# Patient Record
Sex: Female | Born: 1975 | Race: White | Hispanic: No | Marital: Married | Smoking: Never smoker
Health system: Southern US, Community
[De-identification: ages and names within clinical notes are randomized; demographics above are authoritative.]

## PROBLEM LIST (undated history)

## (undated) DIAGNOSIS — L719 Rosacea, unspecified: Secondary | ICD-10-CM

## (undated) DIAGNOSIS — Z8619 Personal history of other infectious and parasitic diseases: Secondary | ICD-10-CM

## (undated) HISTORY — DX: Personal history of other infectious and parasitic diseases: Z86.19

## (undated) HISTORY — DX: Rosacea, unspecified: L71.9

## (undated) HISTORY — PX: KNEE SURGERY: SHX244

---

## 2011-08-29 ENCOUNTER — Other Ambulatory Visit: Payer: Self-pay | Admitting: Family Medicine

## 2011-08-29 ENCOUNTER — Ambulatory Visit
Admission: RE | Admit: 2011-08-29 | Discharge: 2011-08-29 | Disposition: A | Discharge status: Home / Self Care | Payer: BC Managed Care – PPO | Source: Ambulatory Visit | Attending: Family Medicine | Admitting: Family Medicine

## 2011-08-29 DIAGNOSIS — J209 Acute bronchitis, unspecified: Secondary | ICD-10-CM

## 2011-08-29 DIAGNOSIS — R05 Cough: Secondary | ICD-10-CM

## 2011-08-29 MED ORDER — IOHEXOL 300 MG/ML  SOLN
75.0000 mL | Freq: Once | INTRAMUSCULAR | Status: AC | PRN
  Administered 2011-08-29: 75 mL via INTRAVENOUS

## 2011-09-01 ENCOUNTER — Other Ambulatory Visit: Payer: Self-pay

## 2011-09-02 ENCOUNTER — Other Ambulatory Visit: Payer: Self-pay | Admitting: Family Medicine

## 2011-09-02 DIAGNOSIS — K7689 Other specified diseases of liver: Secondary | ICD-10-CM

## 2011-09-11 ENCOUNTER — Other Ambulatory Visit: Payer: BC Managed Care – PPO

## 2011-10-01 ENCOUNTER — Ambulatory Visit
Admission: RE | Admit: 2011-10-01 | Discharge: 2011-10-01 | Disposition: A | Discharge status: Home / Self Care | Payer: BC Managed Care – PPO | Source: Ambulatory Visit | Attending: Family Medicine | Admitting: Family Medicine

## 2011-10-01 DIAGNOSIS — K7689 Other specified diseases of liver: Secondary | ICD-10-CM

## 2011-10-01 MED ORDER — GADOXETATE DISODIUM 0.25 MMOL/ML IV SOLN: 6.0000 mL | Freq: Once | INTRAVENOUS | Status: AC | PRN

## 2014-01-01 ENCOUNTER — Ambulatory Visit: Payer: BC Managed Care – PPO

## 2014-01-01 ENCOUNTER — Ambulatory Visit (INDEPENDENT_AMBULATORY_CARE_PROVIDER_SITE_OTHER): Payer: BC Managed Care – PPO | Admitting: Family Medicine

## 2014-01-01 VITALS — BP 104/64 | HR 84 | Temp 98.3°F | Resp 16 | Ht 64.5 in | Wt 125.6 lb

## 2014-01-01 DIAGNOSIS — M25569 Pain in unspecified knee: Secondary | ICD-10-CM

## 2014-01-01 DIAGNOSIS — M25561 Pain in right knee: Secondary | ICD-10-CM

## 2014-01-01 DIAGNOSIS — T148XXA Other injury of unspecified body region, initial encounter: Secondary | ICD-10-CM

## 2014-01-01 NOTE — Progress Notes (Signed)
Chief Complaint:  Chief Complaint  Patient presents with  . Knee Injury    Rt, skiing yesterday afternoon, heard and felt it pop    HPI: Sheila Jensen is a 38 y.o. female who is here for  Right knee pain since yesterday, she [place] [employer] when she was wedging her skis and then had another skier hit her skis and her knees knocke against each other , heard her knees pop. She has not had any numbness or tingling. Denies weakness. Feels slighly unstable when she moves her knee in a certain position, otherwise she has been weightbearing on it. She is denies clicks, deneis constant pain. When she moves knee ie bednign it or pivot it she has sharp pain on the medial side of right knee and also pop and click but not always. Has tried ibuprofen and ice. No prior knee injuries.   History reviewed. No pertinent past medical history. History reviewed. No pertinent past surgical history. History   Social History  . Marital Status: Married    Spouse Name: N/A    Number of Children: N/A  . Years of Education: N/A   Social History Main Topics  . Smoking status: Never Smoker   . Smokeless tobacco: None  . Alcohol Use: No  . Drug Use: No  . Sexual Activity: None   Other Topics Concern  . None   Social History Narrative  . None   History reviewed. No pertinent family history. No Known Allergies Prior to Admission medications   Not on File     ROS: The patient denies fevers, chills, night sweats, unintentional weight loss, chest pain, palpitations, wheezing, dyspnea on exertion, nausea, vomiting, abdominal pain, dysuria, hematuria, melena, numbness, weakness, or tingling.   All other systems have been reviewed and were otherwise negative with the exception of those mentioned in the HPI and as above.    PHYSICAL EXAM: Filed Vitals:   01/01/14 1738  BP: 104/64  Pulse: 84  Temp: 98.3 F (36.8 C)  Resp: 16   Filed Vitals:   01/01/14 1738  Height: 5' 4.5" (1.638 m)    Weight: 125 lb 9.6 oz (56.972 kg)   Body mass index is 21.23 kg/(m^2).  General: Alert, no acute distress HEENT:  Normocephalic, atraumatic, oropharynx patent. EOMI, PERRLA Cardiovascular:  Regular rate and rhythm, no rubs murmurs or gallops.  No Carotid bruits, radial pulse intact. No pedal edema.  Respiratory: Clear to auscultation bilaterally.  No wheezes, rales, or rhonchi.  No cyanosis, no use of accessory musculature GI: No organomegaly, abdomen is soft and non-tender, positive bowel sounds.  No masses. Skin: No rashes. Neurologic: Facial musculature symmetric. Psychiatric: Patient is appropriate throughout our interaction. Lymphatic: No cervical lymphadenopathy Musculoskeletal: Gait intact. Right knee-no deformity, no crepitus, minimal swelling at medial coll ligament area, no jt line tenderness, + MCL tenderness Neg Lachman, neg ant drawer 5/5 strength Quadricep is nl in appearance, good strength   LABS: No results found for this or any previous visit.   EKG/XRAY:   Primary read interpreted by Dr. Conley Rolls at [facility]. No fx/dislocation   ASSESSMENT/PLAN: Encounter Diagnoses  Name Primary?  . Knee pain, right Yes  . Sprain and strain    Otc meds Knee brace given She does not want anything stronger If no improvement  and instability gets worse then refer to ortho F/u prn  Gross sideeffects, risk and benefits, and alternatives of medications d/w patient. Patient is aware that all medications  have potential sideeffects and we are unable to predict every sideeffect or drug-drug interaction that may occur.  Hamilton CapriLE, Olukemi Panchal PHUONG, DO 01/01/2014 6:05 PM

## 2014-01-19 ENCOUNTER — Telehealth: Payer: Self-pay

## 2014-01-19 NOTE — Telephone Encounter (Signed)
Pt is still having right knee pain and would like to talk with dr Conley Rollsle about getting a referral to an orthopedics office in [place] as [place] in [place]   Best number [phone]

## 2014-01-24 NOTE — Telephone Encounter (Signed)
Please advise 

## 2014-01-25 NOTE — Telephone Encounter (Signed)
Made appt for her for Friday March 27 @ 9 AM with Albertha Gheeebecca Bassett MD [facility] Advise to take ibuprofen 600 mg TID since not taking it Stopped wearing knee brace after 1 day, wanted more mobility She has issues with medial knee tenderness and also flexion and extension

## 2016-02-23 IMAGING — CR DG KNEE COMPLETE 4+V*R*
5 series · 5 of 5 positions shown · non-contrast
Comparison: None.

CLINICAL DATA: Skiing injury. Twisting injury to the right knee
with pain.

EXAM:
RIGHT KNEE - COMPLETE 4+ VIEW

[AP]
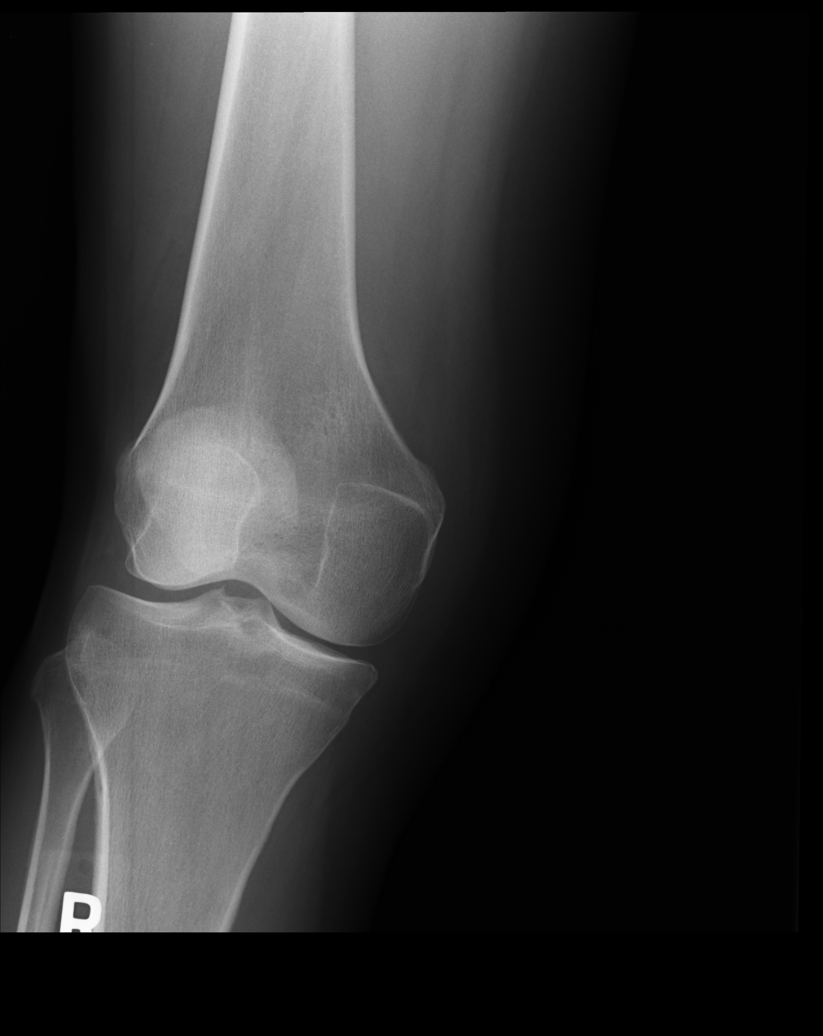

[lateral]
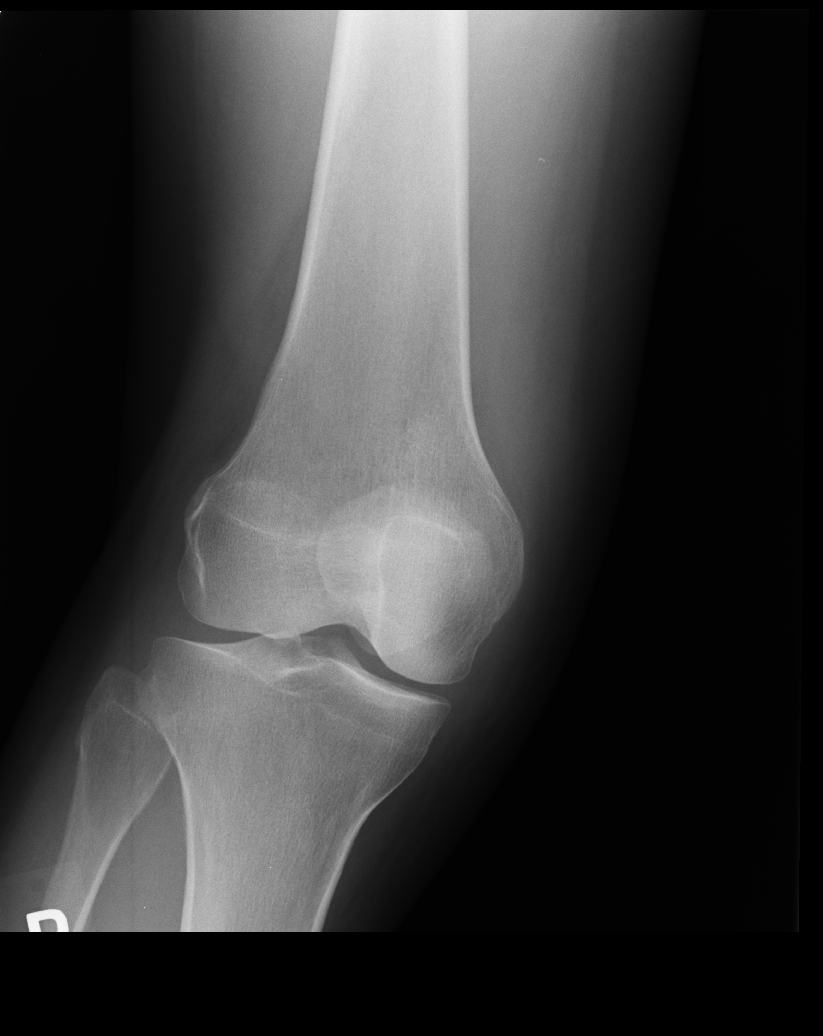

[ap ext rot]
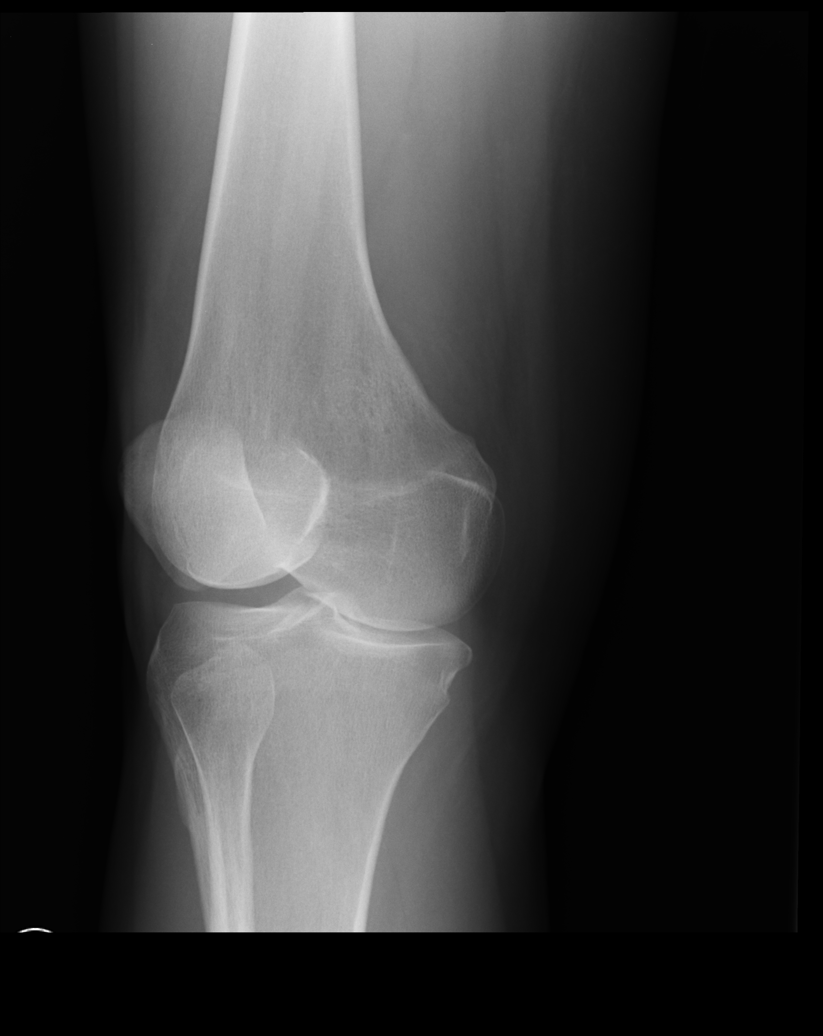

[ap int rot]
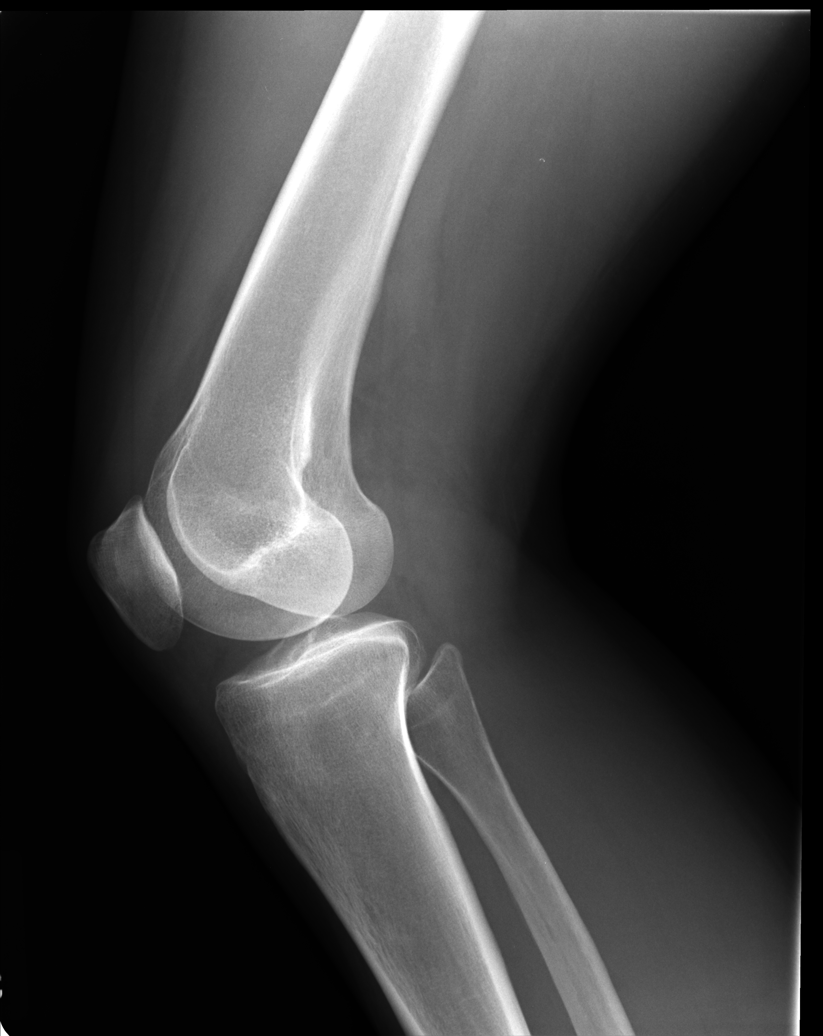

[sunrise]
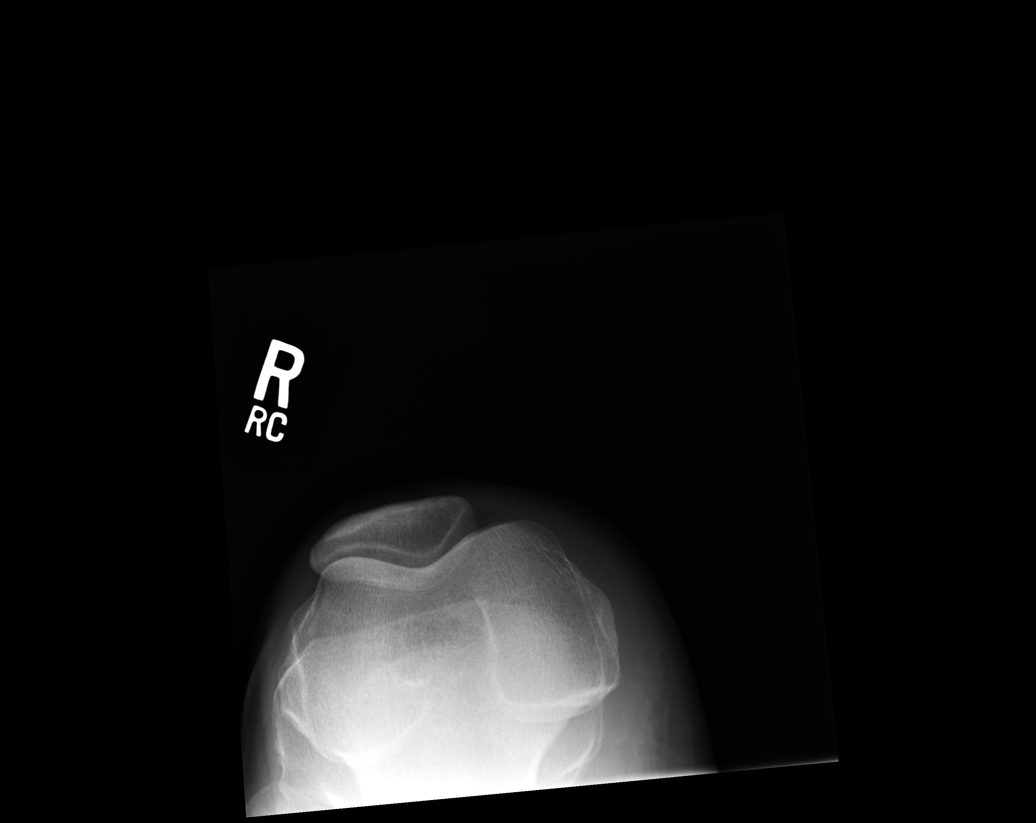

[5 of 5 positions shown; findings below may reference images not displayed]

FINDINGS: There is no evidence of fracture, dislocation, or joint effusion.
There is no evidence of arthropathy or other focal bone abnormality.
Soft tissues are unremarkable.
IMPRESSION: Negative.

## 2017-08-10 ENCOUNTER — Ambulatory Visit (INDEPENDENT_AMBULATORY_CARE_PROVIDER_SITE_OTHER): Payer: BLUE CROSS/BLUE SHIELD | Admitting: Physician Assistant

## 2017-08-10 ENCOUNTER — Encounter: Payer: Self-pay | Admitting: Physician Assistant

## 2017-08-10 VITALS — BP 114/60 | HR 85 | Temp 98.7°F | Resp 12 | Ht 65.0 in | Wt 119.0 lb

## 2017-08-10 DIAGNOSIS — Z23 Encounter for immunization: Secondary | ICD-10-CM

## 2017-08-10 DIAGNOSIS — R195 Other fecal abnormalities: Secondary | ICD-10-CM

## 2017-08-10 DIAGNOSIS — L719 Rosacea, unspecified: Secondary | ICD-10-CM | POA: Insufficient documentation

## 2017-08-10 NOTE — Progress Notes (Signed)
   Patient presents to clinic today c/o abdominal symptoms. Endorses bloating starting in summer. States this has resolved but she has noted some increased frequency of stools. Has to go to the restroom within 1 hour of eating, regardless of what she eats. Denies melena, hematochezia. Endorses consistent diet. Endorses chronic stressors. Denies anxiety or depressed mood.    Past Medical History:  Diagnosis Date  . History of chickenpox   . Rosacea     No current outpatient prescriptions on file prior to visit.   No current facility-administered medications on file prior to visit.     Allergies  Allergen Reactions  . Minocycline Other (See Comments)    Mouth sores    Family History  Problem Relation Age of Onset  . Healthy Mother   . Cancer Father        Skin  . Hypertension Father   . Healthy Sister   . Healthy Maternal Grandmother   . Cancer Maternal Grandfather        Brain    Social History   Social History  . Marital status: Married    Spouse name: N/A  . Number of children: N/A  . Years of education: N/A   Social History Main Topics  . Smoking status: Never Smoker  . Smokeless tobacco: Never Used  . Alcohol use Yes     Comment: 1--2 glasses a week  . Drug use: No  . Sexual activity: Yes    Birth control/ protection: IUD   Other Topics Concern  . None   Social History Narrative  . None    Review of Systems - See HPI.  All other ROS are negative.  BP 114/60   Pulse 85   Temp 98.7 F (37.1 C) (Oral)   Resp 12   Ht '5\' 5"'$  (1.651 m)   Wt 119 lb (54 kg)   SpO2 98%   BMI 19.80 kg/m   Physical Exam  Constitutional: She is oriented to person, place, and time and well-developed, well-nourished, and in no distress.  HENT:  Head: Normocephalic and atraumatic.  Eyes: Conjunctivae are normal.  Cardiovascular: Normal rate, regular rhythm, normal heart sounds and intact distal pulses.   Pulmonary/Chest: Effort normal and breath sounds normal. No  respiratory distress. She has no wheezes. She has no rales. She exhibits no tenderness.  Abdominal: Soft. She exhibits no mass. There is no tenderness. There is no rebound and no guarding.  Bowel sounds mildly hyperactive  Neurological: She is alert and oriented to person, place, and time. No cranial nerve deficit.  Skin: Skin is warm and dry. No rash noted.  Psychiatric: Affect normal.  Vitals reviewed.  Assessment/Plan: 1. Loose stools Does not seem consistent with infectious. 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 Will check labs today. Start fiber supplement and probiotic. Will alter regimen based on results and response to supportive measures. - CBC - TSH - Comp Met (CMET)  2. Need for immunization against influenza FLu shot given today. - Flu Vaccine QUAD 36+ mos IM   Leeanne Rio, PA-C

## 2017-08-10 NOTE — Patient Instructions (Signed)
Please go to the lab for blood work. I will call with results. Start a daily probiotic -- I recommend Align.   We will alter regimen based on results.

## 2017-08-10 NOTE — Progress Notes (Signed)
Pre visit review using our clinic review tool, if applicable. No additional management support is needed unless otherwise documented below in the visit note. 

## 2017-08-11 LAB — COMPREHENSIVE METABOLIC PANEL
AG Ratio: 2 (calc) (ref 1.0–2.5)
ALKALINE PHOSPHATASE (APISO): 42 U/L (ref 33–115)
ALT: 16 U/L (ref 6–29)
AST: 23 U/L (ref 10–30)
Albumin: 4.7 g/dL (ref 3.6–5.1)
BILIRUBIN TOTAL: 0.6 mg/dL (ref 0.2–1.2)
BUN: 13 mg/dL (ref 7–25)
CALCIUM: 9.6 mg/dL (ref 8.6–10.2)
CO2: 20 mmol/L (ref 20–32)
CREATININE: 0.82 mg/dL (ref 0.50–1.10)
Chloride: 108 mmol/L (ref 98–110)
Globulin: 2.3 g/dL (calc) (ref 1.9–3.7)
Glucose, Bld: 103 mg/dL — ABNORMAL HIGH (ref 65–99)
Potassium: 4 mmol/L (ref 3.5–5.3)
Sodium: 142 mmol/L (ref 135–146)
TOTAL PROTEIN: 7 g/dL (ref 6.1–8.1)

## 2017-08-11 LAB — CBC
HCT: 37.8 % (ref 35.0–45.0)
Hemoglobin: 12.9 g/dL (ref 11.7–15.5)
MCH: 32.7 pg (ref 27.0–33.0)
MCHC: 34.1 g/dL (ref 32.0–36.0)
MCV: 95.7 fL (ref 80.0–100.0)
MPV: 11.6 fL (ref 7.5–12.5)
PLATELETS: 180 10*3/uL (ref 140–400)
RBC: 3.95 10*6/uL (ref 3.80–5.10)
RDW: 11.3 % (ref 11.0–15.0)
WBC: 4.7 10*3/uL (ref 3.8–10.8)

## 2017-08-11 LAB — TSH: TSH: 1.7 m[IU]/L

## 2017-08-28 ENCOUNTER — Ambulatory Visit (INDEPENDENT_AMBULATORY_CARE_PROVIDER_SITE_OTHER): Payer: BLUE CROSS/BLUE SHIELD | Admitting: Physician Assistant

## 2017-08-28 ENCOUNTER — Encounter: Payer: Self-pay | Admitting: Physician Assistant

## 2017-08-28 VITALS — BP 90/60 | HR 71 | Temp 98.5°F | Resp 14 | Ht 65.0 in | Wt 119.0 lb

## 2017-08-28 DIAGNOSIS — Z299 Encounter for prophylactic measures, unspecified: Secondary | ICD-10-CM | POA: Diagnosis not present

## 2017-08-28 DIAGNOSIS — K219 Gastro-esophageal reflux disease without esophagitis: Secondary | ICD-10-CM

## 2017-08-28 NOTE — Patient Instructions (Addendum)
Please go to the lab for blood work. I will call you with results.  Keep up with healthy diet and exercise regimen.  Continue Omeprazole for 2 weeks before stopping. Continue Align. Call me if there are any residual symptoms.

## 2017-08-28 NOTE — Progress Notes (Signed)
Patient presents to clinic today for follow-up of GI issues. At last visit, patient was started on daily probiotic for bloating and mild IBS-like symptoms. Patient was also instructed to make dietary changes. States she has been following advice and has noted complete resolution of abdominal bloating, cramping and loose stools. Has noted a few days of heartburn. Denies nausea/vomiting or abdominal pain. Has started OTC omeprazole with resolution of symptoms.  Past Medical History:  Diagnosis Date  . History of chickenpox   . Rosacea     Current Outpatient Prescriptions on File Prior to Visit  Medication Sig Dispense Refill  . doxycycline (VIBRAMYCIN) 100 MG capsule TK 1 C PO QD AS DIRECTED  1  . FINACEA 15 % cream APPLY BID TO FACE  2  . levonorgestrel (MIRENA) 20 MCG/24HR IUD by Intrauterine route.     No current facility-administered medications on file prior to visit.     Allergies  Allergen Reactions  . Minocycline Other (See Comments)    Mouth sores    Family History  Problem Relation Age of Onset  . Healthy Mother   . Cancer Father        Skin  . Hypertension Father   . Healthy Sister   . Healthy Maternal Grandmother   . Cancer Maternal Grandfather        Brain    Social History   Social History  . Marital status: Married    Spouse name: N/A  . Number of children: N/A  . Years of education: N/A   Social History Main Topics  . Smoking status: Never Smoker  . Smokeless tobacco: Never Used  . Alcohol use Yes     Comment: 1--2 glasses a week  . Drug use: No  . Sexual activity: Yes    Birth control/ protection: IUD   Other Topics Concern  . None   Social History Narrative  . None   Review of Systems - See HPI.  All other ROS are negative.  BP 90/60   Pulse 71   Temp 98.5 F (36.9 C) (Oral)   Resp 14   Ht 5' 5"  (1.651 m)   Wt 119 lb (54 kg)   SpO2 99%   BMI 19.80 kg/m   Physical Exam  Constitutional: She is oriented to person, place, and  time and well-developed, well-nourished, and in no distress.  HENT:  Head: Normocephalic and atraumatic.  Eyes: Conjunctivae are normal.  Neck: Neck supple.  Cardiovascular: Normal rate, regular rhythm, normal heart sounds and intact distal pulses.   Pulmonary/Chest: Effort normal and breath sounds normal. No respiratory distress. She has no wheezes. She has no rales. She exhibits no tenderness.  Abdominal: Soft. Bowel sounds are normal. She exhibits no distension. There is no tenderness.  Neurological: She is alert and oriented to person, place, and time.  Skin: Skin is warm and dry. No rash noted.  Psychiatric: Affect normal.  Vitals reviewed.   Recent Results (from the past 2160 hour(s))  CBC     Status: None   Collection Time: 08/10/17  4:37 PM  Result Value Ref Range   WBC 4.7 3.8 - 10.8 Thousand/uL   RBC 3.95 3.80 - 5.10 Million/uL   Hemoglobin 12.9 11.7 - 15.5 g/dL   HCT 37.8 35.0 - 45.0 %   MCV 95.7 80.0 - 100.0 fL   MCH 32.7 27.0 - 33.0 pg   MCHC 34.1 32.0 - 36.0 g/dL   RDW 11.3 11.0 - 15.0 %  Platelets 180 140 - 400 Thousand/uL   MPV 11.6 7.5 - 12.5 fL  TSH     Status: None   Collection Time: 08/10/17  4:37 PM  Result Value Ref Range   TSH 1.70 mIU/L    Comment:           Reference Range .           > or = 20 Years  0.40-4.50 .                Pregnancy Ranges           First trimester    0.26-2.66           Second trimester   0.55-2.73           Third trimester    0.43-2.91   Comp Met (CMET)     Status: Abnormal   Collection Time: 08/10/17  4:37 PM  Result Value Ref Range   Glucose, Bld 103 (H) 65 - 99 mg/dL    Comment: .            Fasting reference interval . For someone without known diabetes, a glucose value between 100 and 125 mg/dL is consistent with prediabetes and should be confirmed with a follow-up test. .    BUN 13 7 - 25 mg/dL   Creat 0.82 0.50 - 1.10 mg/dL   BUN/Creatinine Ratio NOT APPLICABLE 6 - 22 (calc)   Sodium 142 135 - 146 mmol/L    Potassium 4.0 3.5 - 5.3 mmol/L   Chloride 108 98 - 110 mmol/L   CO2 20 20 - 32 mmol/L   Calcium 9.6 8.6 - 10.2 mg/dL   Total Protein 7.0 6.1 - 8.1 g/dL   Albumin 4.7 3.6 - 5.1 g/dL   Globulin 2.3 1.9 - 3.7 g/dL (calc)   AG Ratio 2.0 1.0 - 2.5 (calc)   Total Bilirubin 0.6 0.2 - 1.2 mg/dL   Alkaline phosphatase (APISO) 42 33 - 115 U/L   AST 23 10 - 30 U/L   ALT 16 6 - 29 U/L    Assessment/Plan: 1. Preventive measure Patient fasting. Recent labs looked great. Due for lipid panel. Will obtain today. Dietary and exercise recommendations reviewed. - Lipid panel  2. Gastroesophageal reflux disease without esophagitis Continue probiotic. Discussed dietary changes to help with GERD. Omeprazole OTC x 14 days. Monitor for any recurrence of symptoms.   Leeanne Rio, PA-C

## 2017-08-28 NOTE — Progress Notes (Signed)
Pre visit review using our clinic review tool, if applicable. No additional management support is needed unless otherwise documented below in the visit note. 

## 2017-08-29 LAB — LIPID PANEL
Cholesterol: 155 mg/dL (ref <200)
HDL: 56 mg/dL (ref >50)
LDL Cholesterol (Calc): 86 mg/dL (calc)
NON-HDL CHOLESTEROL (CALC): 99 mg/dL (ref <130)
TRIGLYCERIDES: 58 mg/dL (ref <150)
Total CHOL/HDL Ratio: 2.8 (calc) (ref <5.0)

## 2017-10-02 ENCOUNTER — Ambulatory Visit: Payer: Self-pay | Admitting: Family Medicine

## 2018-12-09 ENCOUNTER — Ambulatory Visit: Payer: Self-pay | Admitting: *Deleted

## 2018-12-09 NOTE — Telephone Encounter (Signed)
Please advise 

## 2018-12-09 NOTE — Telephone Encounter (Signed)
Patient has not been seen in > 1 year so she will have to be seen.

## 2018-12-09 NOTE — Telephone Encounter (Signed)
Message from Vance sent at 12/09/2018 3:46 PM EST   Pt stated her daughter was diagnosed with the flu and she would like Tamiflu as preventative. Pt requests that the Rx for Tamiflu be sent to [id number] - [place], [address] Cb# [phone]    Pt calling to request prescription for Tamiflu. Pt states her daughter was diagnosed with the flu on today. Pt states she has a sore throat and headache. Pt states she has not received the flu vaccine this year. Pt requesting for prescription for Tamiflu to be sent to [facility] in [place]. Reason for Disposition . [1] Influenza EXPOSURE within last 48 hours (2 days) AND [2] NOT HIGH RISK AND [3] strongly requests antiviral medication  Answer Assessment - Initial Assessment Questions 1. TYPE of EXPOSURE: "How were you exposed?" (e.g., close contact, not a close contact)     Pt's daughter was diagnosed with the flu today 2. DATE of EXPOSURE: "When did the exposure occur?" (e.g., hour, days, weeks)     Pt states daughter started to have symptoms on Monday and was diagnosed with the flu on Monday 3. PREGNANCY: "Is there any chance you are pregnant?" "When was your last menstrual period?"     No, pt has an IUD and does not recall when she had her last menstrual period 4. HIGH RISK for COMPLICATIONS: "Do you have any heart or lung problems? Do you have a weakened immune system?" (e.g., CHF, COPD, asthma, HIV positive, chemotherapy, renal failure, diabetes mellitus, sickle cell anemia)     No 5. SYMPTOMS: "Do you have any symptoms?" (e.g., cough, fever, sore throat, difficulty breathing).     Sore throat and headache  Protocols used: INFLUENZA EXPOSURE-A-AH

## 2018-12-10 NOTE — Telephone Encounter (Signed)
Advised patient since she has not been seen in over a year. Advised patient if she starts developing symptoms she would need to be seen. She is agreeable.

## 2019-01-25 ENCOUNTER — Encounter: Payer: Self-pay | Admitting: Emergency Medicine

## 2020-04-27 LAB — HM MAMMOGRAPHY

## 2020-05-10 ENCOUNTER — Encounter: Payer: Self-pay | Admitting: Emergency Medicine
